# Patient Record
Sex: Male | Born: 1995 | Race: White | Hispanic: No | Marital: Single | Smoking: Current every day smoker
Health system: Southern US, Community
[De-identification: ages and names within clinical notes are randomized; demographics above are authoritative.]

---
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
## 2015-03-12 ENCOUNTER — Encounter: Payer: Self-pay | Admitting: Emergency Medicine

## 2015-03-12 ENCOUNTER — Emergency Department: Payer: BLUE CROSS/BLUE SHIELD

## 2015-03-12 ENCOUNTER — Emergency Department
Admission: EM | Admit: 2015-03-12 | Discharge: 2015-03-12 | Disposition: A | Discharge status: Home / Self Care | Payer: BLUE CROSS/BLUE SHIELD | Attending: Emergency Medicine | Admitting: Emergency Medicine

## 2015-03-12 DIAGNOSIS — F17210 Nicotine dependence, cigarettes, uncomplicated: Secondary | ICD-10-CM | POA: Diagnosis not present

## 2015-03-12 DIAGNOSIS — Y998 Other external cause status: Secondary | ICD-10-CM | POA: Diagnosis not present

## 2015-03-12 DIAGNOSIS — Y9289 Other specified places as the place of occurrence of the external cause: Secondary | ICD-10-CM | POA: Diagnosis not present

## 2015-03-12 DIAGNOSIS — Y9389 Activity, other specified: Secondary | ICD-10-CM | POA: Diagnosis not present

## 2015-03-12 DIAGNOSIS — T50901A Poisoning by unspecified drugs, medicaments and biological substances, accidental (unintentional), initial encounter: Secondary | ICD-10-CM

## 2015-03-12 DIAGNOSIS — T507X1A Poisoning by analeptics and opioid receptor antagonists, accidental (unintentional), initial encounter: Secondary | ICD-10-CM | POA: Insufficient documentation

## 2015-03-12 NOTE — Discharge Instructions (Signed)
Accidental Overdose °A drug overdose occurs when a chemical substance (drug or medication) is used in amounts large enough to overcome a person. This may result in severe illness or death. This is a type of poisoning. Accidental overdoses of medications or other substances come from a variety of reasons. When this happens accidentally, it is often because the person taking the substance does not know enough about what they have taken. Drugs which commonly cause overdose deaths are alcohol, psychotropic medications (medications which affect the mind), pain medications, illegal drugs (street drugs) such as cocaine and heroin, and multiple drugs taken at the same time. It may result from careless behavior (such as over-indulging at a party). Other causes of overdose may include multiple drug use, a lapse in memory, or drug use after a period of no drug use.  °Sometimes overdosing occurs because a person cannot remember if they have taken their medication.  °A common unintentional overdose in young children involves multi-vitamins containing iron. Iron is a part of the hemoglobin molecule in blood. It is used to transport oxygen to living cells. When taken in small amounts, iron allows the body to restock hemoglobin. In large amounts, it causes problems in the body. If this overdose is not treated, it can lead to death. °Never take medicines that show signs of tampering or do not seem quite right. Never take medicines in the dark or in poor lighting. Read the label and check each dose of medicine before you take it. When adults are poisoned, it happens most often through carelessness or lack of information. Taking medicines in the dark or taking medicine prescribed for someone else to treat the same type of problem is a dangerous practice. °SYMPTOMS  °Symptoms of overdose depend on the medication and amount taken. They can vary from over-activity with stimulant over-dosage, to sleepiness from depressants such as  alcohol, narcotics and tranquilizers. Confusion, dizziness, nausea and vomiting may be present. If problems are severe enough coma and death may result. °DIAGNOSIS  °Diagnosis and management are generally straightforward if the drug is known. Otherwise it is more difficult. At times, certain symptoms and signs exhibited by the patient, or blood tests, can reveal the drug in question.  °TREATMENT  °In an emergency department, most patients can be treated with supportive measures. Antidotes may be available if there has been an overdose of opioids or benzodiazepines. A rapid improvement will often occur if this is the cause of overdose. °At home or away from medical care: °· There may be no immediate problems or warning signs in children. °· Not everything works well in all cases of poisoning. °· Take immediate action. Poisons may act quickly. °· If you think someone has swallowed medicine or a household product, and the person is unconscious, having seizures (convulsions), or is not breathing, immediately call for an ambulance. °IF a person is conscious and appears to be doing OK but has swallowed a poison: °· Do not wait to see what effect the poison will have. Immediately call a poison control center (listed in the white pages of your telephone book under "Poison Control" or inside the front cover with other emergency numbers). Some poison control centers have TTY capability for the deaf. Check with your local center if you or someone in your family requires this service. °· Keep the container so you can read the label on the product for ingredients. °· Describe what, when, and how much was taken and the age and condition of the person poisoned.   Inform them if the person is vomiting, choking, drowsy, shows a change in color or temperature of skin, is conscious or unconscious, or is convulsing.  Do not cause vomiting unless instructed by medical personnel. Do not induce vomiting or force liquids into a person who  is convulsing, unconscious, or very drowsy. Stay calm and in control.   Activated charcoal also is sometimes used in certain types of poisoning and you may wish to add a supply to your emergency medicines. It is available without a prescription. Call a poison control center before using this medication. PREVENTION  Thousands of children die every year from unintentional poisoning. This may be from household chemicals, poisoning from carbon monoxide in a car, taking their parent's medications, or simply taking a few iron pills or vitamins with iron. Poisoning comes from unexpected sources.  Store medicines out of the sight and reach of children, preferably in a locked cabinet. Do not keep medications in a food cabinet. Always store your medicines in a secure place. Get rid of expired medications.  If you have children living with you or have them as occasional guests, you should have child-resistant caps on your medicine containers. Keep everything out of reach. Child proof your home.  If you are called to the telephone or to answer the door while you are taking a medicine, take the container with you or put the medicine out of the reach of small children.  Do not take your medication in front of children. Do not tell your child how good a medication is and how good it is for them. They may get the idea it is more of a treat.  If you are an adult and have accidentally taken an overdose, you need to consider how this happened and what can be done to prevent it from happening again. If this was from a street drug or alcohol, determine if there is a problem that needs addressing. If you are not sure a problems exists, it is easy to talk to a professional and ask them if they think you have a problem. It is better to handle this problem in this way before it happens again and has a much worse consequence.   This information is not intended to replace advice given to you by your health care provider. Make  sure you discuss any questions you have with your health care provider.   Document Released: 03/09/2004 Document Revised: 01/14/2014 Document Reviewed: 06/13/2014 Elsevier Interactive Patient Education [employer]  Drug Overdose Drug overdose happens when you take too much of a drug. An overdose can occur with illegal drugs, prescription drugs, or over-the-counter (OTC) drugs. The effects of drug overdose can be mild, dangerous, or even deadly. CAUSES Drug overdose may be caused by:  Taking too much of a drug on purpose.  Taking too much of a drug by accident.  An error made by a health care provider who prescribes a drug.  An error made by a pharmacist who fills the prescription order. Drugs that commonly cause overdose include:  Mental health drugs.  Pain medicines.  Illegal drugs.  OTC cough and cold medicines.  Heart medicines.  Seizure medicines. RISK FACTORS Drug overdose is more likely in:  Children. They may be attracted to colorful pills. Because of children's small size, even a small amount of a drug can be dangerous.  Elderly people. They may be taking many different drugs. Elderly people may have difficulty reading labels or remembering when they last took  their medicine. The risk of drug overdose is also higher for someone who:  Takes illegal drugs.  Takes a drug and drinks alcohol.  Has a mental health condition. SYMPTOMS Signs and symptoms of drug overdose depend on the drug and the amount that was taken. Common danger signs include:  Behavior changes.  Sleepiness.  Slowed breathing.  Nausea and vomiting.  Seizures.  Changes in eye pupil size (very large or very small). If there are signs of very low blood pressure from a drug overdose (shock), emergency treatment is required. These signs include:  Cold and clammy skin.  Pale skin.  Blue lips.  Very slow breathing.  Extreme sleepiness.  Loss of  consciousness. DIAGNOSIS Drug overdose may be diagnosed based on your symptoms. It is important that you tell your health care provider:  All of the drugs that you have taken.  When you took the drugs.  Whether you were drinking alcohol. Your health care provider will do a physical exam. This exam may include:  Checking and monitoring your heart rate and rhythm, your temperature, and your blood pressure (vital signs).  Checking your breathing and oxygen level. You may also have tests, including:   Urine tests to check for drugs in your system.  Blood tests to check for:  Drugs in your system.  Signs of an imbalance of your blood minerals (electrolytes).  Liver damage.  Kidney damage. TREATMENT Supporting your vital signs and your breathing is the first step in treating a drug overdose. Treatment may also include:  Receiving fluids and electrolytes through an IV tube.  Having a breathing tube (endotracheal tube) inserted in your airway to help you breathe.  Having a tube passed through your nose and into your stomach (nasogastric tube) to wash out your stomach.  Medicines. You may get medicines to:  Make you vomit.  Absorb any medicine that is left in your digestive system (activated charcoal).  Block or reverse the effect of the drug that caused the overdose.  Having your blood filtered through an artificial kidney machine (hemodialysis). You may need this if your overdose is severe or if you have kidney failure.  Having ongoing counseling and mental health support if you intentionally overdosed or used an illegal drug. HOME CARE INSTRUCTIONS  Take medicines only as directed by your health care provider. Always ask your health care provider to discuss the possible side effects of any new drug that you start taking.  Keep a list of all of the drugs that you take, including over-the-counter medicines. Bring this list with you to all of your medical visits.  Read the  drug inserts that come with your medicines.  Do not use illegal drugs.  Do not drink alcohol when taking drugs.  Store all medicines in safety containers that are out of the reach of children.  Keep the phone number of your local poison control center near your phone or on your cell phone.  Get help if you are struggling with alcohol or drug use.  Get help if you are struggling with depression or another mental health problem.  Keep all follow-up visits as directed by your health care provider. This is important. SEEK MEDICAL CARE IF:  Your symptoms return.  You develop any new signs or symptoms when you are taking medicines. SEEK IMMEDIATE MEDICAL CARE IF:  You think that you or someone else may have taken too much of a drug. The hotline of the [facility] is [phone].  You or someone else is having symptoms of a drug overdose.  You have serious thoughts about hurting yourself or others.  You have chest pain.  You have difficulty breathing.  You have a loss of consciousness. Drug overdose is an emergency. Do not wait to see if the symptoms will go away. Get medical help right away. Call your local emergency services (911 in the [place] Do not drive yourself to the hospital.   This information is not intended to replace advice given to you by your health care provider. Make sure you discuss any questions you have with your health care provider.   Document Released: 05/10/2014 Document Reviewed: 05/10/2014 Elsevier Interactive Patient Education [employer]
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
## 2015-03-12 NOTE — ED Provider Notes (Signed)
[facility] Emergency Department Provider Note  ____________________________________________  Time seen: 6:20 PM  I have reviewed the triage vital signs and the nursing notes.   HISTORY  Chief Complaint Drug Overdose    HPI Dwayne Johnson is a 20 y.o. male brought to the ED by EMS to a drug overdose. He is unable to state what it is he took but he crushed up a blue pill and started it. He was found unconscious with agonal respirations. He received bystander CPR, and then EMS gave 4 mg of Narcan with return of adequate respirations and mental status.  Patient reports he is a smoker, does not drink, does not use drugs and has never used any IV drugs. This was the first time for him. He reiterates that he has no idea what he took. Denies any attempt at self-harm. No SI and HI or hallucinations.     History reviewed. No pertinent past medical history. ADHD There are no active problems to display for this patient.    History reviewed. No pertinent past surgical history.   No current outpatient prescriptions on file. None  Allergies Review of patient's allergies indicates no known allergies.   History reviewed. No pertinent family history.  Social History Social History  Substance Use Topics  . Smoking status: Current Every Day Smoker -- 1.00 packs/day    Types: Cigarettes  . Smokeless tobacco: None  . Alcohol Use: Yes    Review of Systems  Constitutional:   No fever or chills. No weight changes Eyes:   No blurry vision or double vision.  ENT:   No sore throat.  Cardiovascular:   No chest pain. Respiratory:   No dyspnea or cough. Gastrointestinal:   Negative for abdominal pain, vomiting and diarrhea.  No BRBPR or melena. Genitourinary:   Negative for dysuria or difficulty urinating. Musculoskeletal:   Negative for back pain. No joint swelling or pain. Skin:   Negative for rash. Neurological:   Negative for headaches, focal weakness or  numbness. Psychiatric:  No anxiety or depression.   Endocrine:  No changes in energy or sleep difficulty.  10-point ROS otherwise negative.  ____________________________________________   PHYSICAL EXAM:  VITAL SIGNS: ED Triage Vitals  Enc Vitals Group     BP 03/12/15 1930 108/58 mmHg     Pulse Rate 03/12/15 1854 98     Resp 03/12/15 1854 16     Temp --      Temp src --      SpO2 03/12/15 1836 100 %     Weight --      Height --      Head Cir --      Peak Flow --      Pain Score --      Pain Loc --      Pain Edu? --      Excl. in GC? --     Vital signs reviewed, nursing assessments reviewed.   Constitutional:   Alert and oriented. Well appearing and in no distress. Eyes:   No scleral icterus. No conjunctival pallor. PERRL. EOMI ENT   Head:   Normocephalic and atraumatic.   Nose:   No congestion/rhinnorhea. No septal hematoma   Mouth/Throat:   MMM, no pharyngeal erythema. No peritonsillar mass.    Neck:   No stridor. No SubQ emphysema. No meningismus. Hematological/Lymphatic/Immunilogical:   No cervical lymphadenopathy. Cardiovascular:   RRR. Symmetric bilateral radial and DP pulses.  No murmurs.  Respiratory:   Normal  respiratory effort without tachypnea nor retractions. Breath sounds are clear and equal bilaterally. No wheezes/rales/rhonchi. Gastrointestinal:   Soft and nontender. Non distended. There is no CVA tenderness.  No rebound, rigidity, or guarding. Genitourinary:   deferred Musculoskeletal:   Nontender with normal range of motion in all extremities. No joint effusions.  No lower extremity tenderness.  No edema. Neurologic:   Normal speech and language.  CN 2-10 normal. Motor grossly intact. No gross focal neurologic deficits are appreciated.  Skin:    Skin is warm, dry and intact. No rash noted.  No petechiae, purpura, or bullae. No track marks Psychiatric:   Mood and affect are normal. ____________________________________________    LABS  (pertinent positives/negatives) (all labs ordered are listed, but only abnormal results are displayed) Labs Reviewed  BASIC METABOLIC PANEL  CBC WITH DIFFERENTIAL/PLATELET   ____________________________________________   EKG  Interpreted by me  Date: 03/12/2015  Rate: 101  Rhythm: normal sinus rhythm  QRS Axis: normal  Intervals: normal  ST/T Wave abnormalities: normal  Conduction Disutrbances: none  Narrative Interpretation: unremarkable      ____________________________________________    RADIOLOGY  Chest x-ray unremarkable  ____________________________________________   PROCEDURES   ____________________________________________   INITIAL IMPRESSION / ASSESSMENT AND PLAN / ED COURSE  Pertinent labs & imaging results that were available during my care of the patient were reviewed by me and considered in my medical decision making (see chart for details).  Patient brought in to ED after resuscitation from an apparent drug overdose with Narcan. This effectively reversed his intoxication and pharmacological, from the overdose. Patient was observed in the emergency department for several hours to ensure that after the Narcan wears off he would not have any recurrence of his symptoms and suppression of mental status or respiratory drive. Chest x-ray unremarkable, no evidence of flash pulmonary edema. Patient is asymptomatic and feels great and wants to go home and go to work tomorrow. Vital signs unremarkable. Reassessment at 10:30 shows the patient continues to do well, no change in status. We'll discharge home. Patient strongly advised to avoid any further substance abuse. I reinforced to him that he really came within an inch of death with this experience.     ____________________________________________   FINAL CLINICAL IMPRESSION(S) / ED DIAGNOSES  Final diagnoses:  Drug overdose, accidental or unintentional, initial encounter      Sharman Cheek,  MD 03/12/15 2249

## 2015-04-18 ENCOUNTER — Ambulatory Visit
Admission: RE | Admit: 2015-04-18 | Discharge: 2015-04-18 | Disposition: A | Discharge status: Home / Self Care | Payer: BLUE CROSS/BLUE SHIELD | Source: Ambulatory Visit | Attending: Family Medicine | Admitting: Family Medicine

## 2015-04-18 ENCOUNTER — Encounter: Payer: Self-pay | Admitting: Family Medicine

## 2015-04-18 ENCOUNTER — Ambulatory Visit (INDEPENDENT_AMBULATORY_CARE_PROVIDER_SITE_OTHER): Payer: BLUE CROSS/BLUE SHIELD | Admitting: Family Medicine

## 2015-04-18 VITALS — BP 116/70 | HR 89 | Temp 98.1°F | Resp 16 | Ht 73.0 in | Wt 189.0 lb

## 2015-04-18 DIAGNOSIS — S36113A Laceration of liver, unspecified degree, initial encounter: Secondary | ICD-10-CM

## 2015-04-18 DIAGNOSIS — S82002A Unspecified fracture of left patella, initial encounter for closed fracture: Secondary | ICD-10-CM | POA: Insufficient documentation

## 2015-04-18 DIAGNOSIS — R17 Unspecified jaundice: Secondary | ICD-10-CM | POA: Diagnosis not present

## 2015-04-18 DIAGNOSIS — S81012A Laceration without foreign body, left knee, initial encounter: Secondary | ICD-10-CM | POA: Insufficient documentation

## 2015-04-18 DIAGNOSIS — S81011A Laceration without foreign body, right knee, initial encounter: Secondary | ICD-10-CM | POA: Insufficient documentation

## 2015-04-18 DIAGNOSIS — M25562 Pain in left knee: Principal | ICD-10-CM

## 2015-04-18 DIAGNOSIS — L03119 Cellulitis of unspecified part of limb: Secondary | ICD-10-CM

## 2015-04-18 DIAGNOSIS — L0390 Cellulitis, unspecified: Secondary | ICD-10-CM | POA: Insufficient documentation

## 2015-04-18 DIAGNOSIS — F17200 Nicotine dependence, unspecified, uncomplicated: Secondary | ICD-10-CM

## 2015-04-18 DIAGNOSIS — Z720 Tobacco use: Secondary | ICD-10-CM

## 2015-04-18 DIAGNOSIS — M25561 Pain in right knee: Secondary | ICD-10-CM

## 2015-04-18 MED ORDER — OXYCODONE-ACETAMINOPHEN 10-325 MG PO TABS: 1.0000 | ORAL_TABLET | Freq: Three times a day (TID) | ORAL | Status: AC | PRN

## 2015-04-18 MED ORDER — CEFDINIR 300 MG PO CAPS: 300.0000 mg | ORAL_CAPSULE | Freq: Every day | ORAL | Status: AC
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
## 2015-04-18 NOTE — Patient Instructions (Signed)
Go to the [facility] on [address] for Knee Xrays

## 2015-04-19 ENCOUNTER — Telehealth: Payer: Self-pay | Admitting: *Deleted

## 2015-04-19 DIAGNOSIS — M25562 Pain in left knee: Secondary | ICD-10-CM

## 2015-04-19 LAB — COMPREHENSIVE METABOLIC PANEL
ALBUMIN: 4.4 g/dL (ref 3.5–5.5)
ALK PHOS: 72 IU/L (ref 39–117)
ALT: 59 IU/L — ABNORMAL HIGH (ref 0–44)
AST: 25 IU/L (ref 0–40)
Albumin/Globulin Ratio: 1.5 (ref 1.2–2.2)
BILIRUBIN TOTAL: 0.7 mg/dL (ref 0.0–1.2)
BUN / CREAT RATIO: 21 — AB (ref 9–20)
BUN: 16 mg/dL (ref 6–20)
CHLORIDE: 100 mmol/L (ref 96–106)
CO2: 24 mmol/L (ref 18–29)
Calcium: 9.6 mg/dL (ref 8.7–10.2)
Creatinine, Ser: 0.77 mg/dL (ref 0.76–1.27)
GFR calc Af Amer: 150 mL/min/{1.73_m2} (ref >59)
GFR calc non Af Amer: 130 mL/min/{1.73_m2} (ref >59)
GLUCOSE: 82 mg/dL (ref 65–99)
Globulin, Total: 2.9 g/dL (ref 1.5–4.5)
Potassium: 5 mmol/L (ref 3.5–5.2)
Sodium: 142 mmol/L (ref 134–144)
Total Protein: 7.3 g/dL (ref 6.0–8.5)

## 2015-04-19 LAB — CBC
HEMOGLOBIN: 14.1 g/dL (ref 12.6–17.7)
Hematocrit: 40.2 % (ref 37.5–51.0)
MCH: 30.2 pg (ref 26.6–33.0)
MCHC: 35.1 g/dL (ref 31.5–35.7)
MCV: 86 fL (ref 79–97)
PLATELETS: 457 10*3/uL — AB (ref 150–379)
RBC: 4.67 x10E6/uL (ref 4.14–5.80)
RDW: 13 % (ref 12.3–15.4)
WBC: 7.4 10*3/uL (ref 3.4–10.8)

## 2015-04-19 NOTE — Telephone Encounter (Signed)
Patient was notified of results. Patient expressed understanding. Referral entered in epic.

## 2015-04-19 NOTE — Telephone Encounter (Addendum)
Please refer to orthopedics. Patient prefers to see [facility], if his insurance cover them? Also call pt's mom with any information. Dwayne Johnson [phone]. Thanks!

## 2015-04-19 NOTE — Telephone Encounter (Signed)
-----   Message from Malva Limesonald E Fisher, MD sent at 04/19/2015  7:41 AM EDT ----- Normal blood cell count and liver functions. Left knee cap is fractured. I think this should heal up without any particular treatment, but should be referred to orthopedics to follow up on this.

## 2015-05-08 DEATH — deceased

## 2016-04-15 IMAGING — CR DG KNEE COMPLETE 4+V*L*
1 series · 4 of 4 positions shown · non-contrast
Comparison: No prior .

CLINICAL DATA: Injury.  Pain .

EXAM:
LEFT KNEE - COMPLETE 4+ VIEW

[Series 1: dg knee complete 4 views left · 0.14mm/px · 4 of 4 slices shown]
[im 1/4]
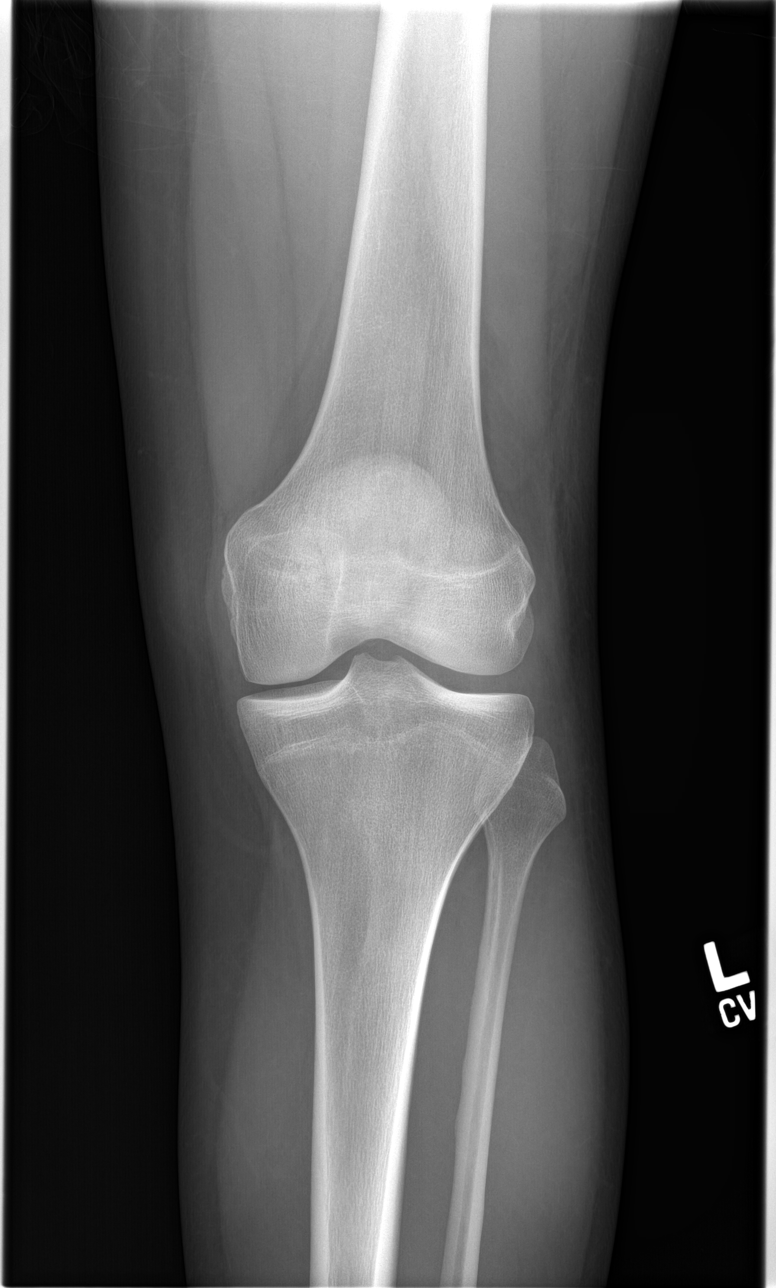
[im 2/4]
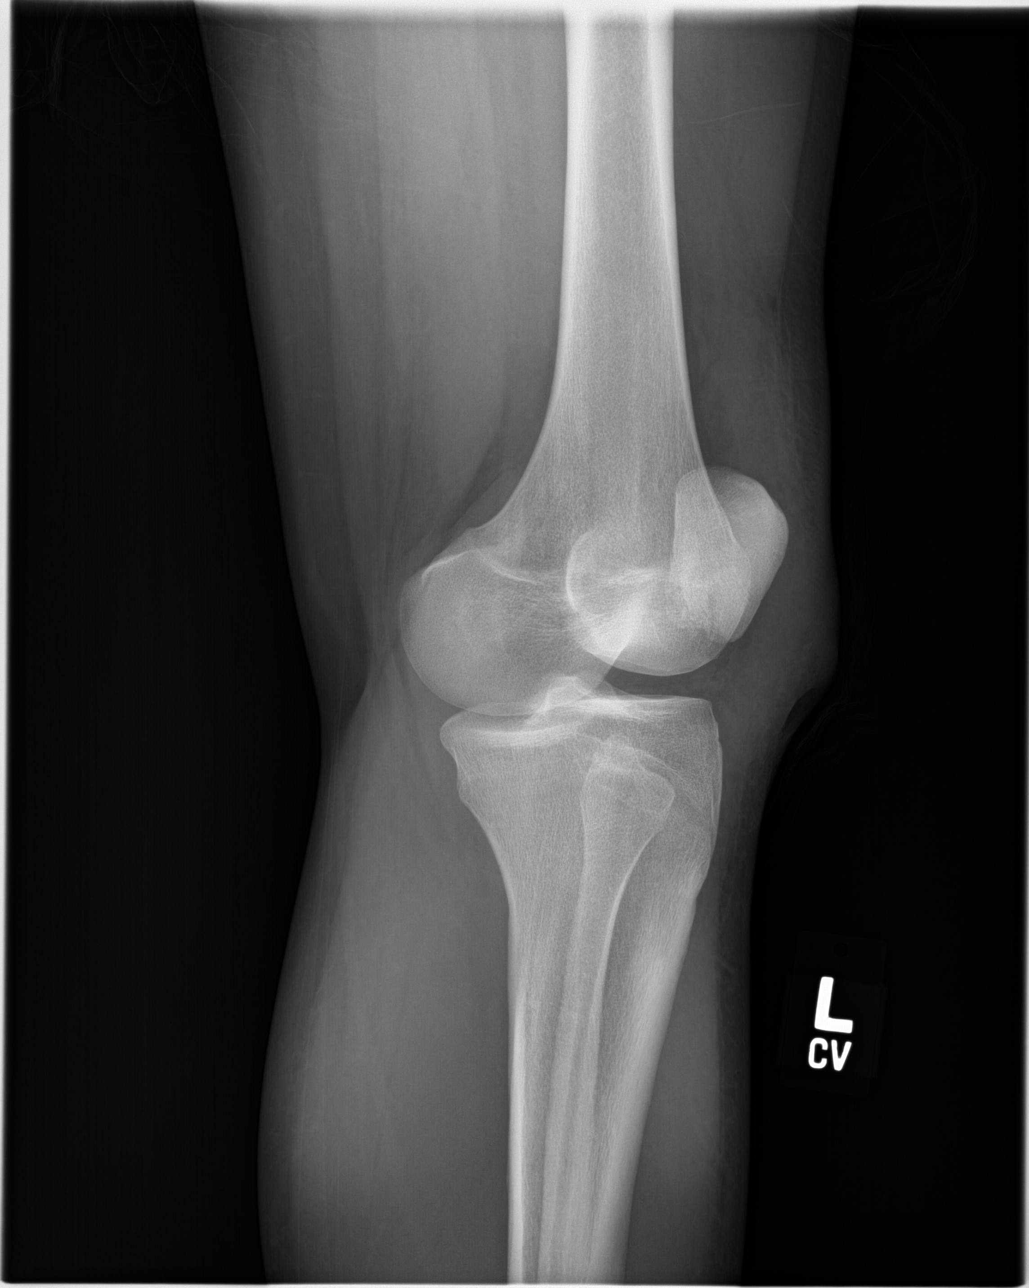
[im 3/4]
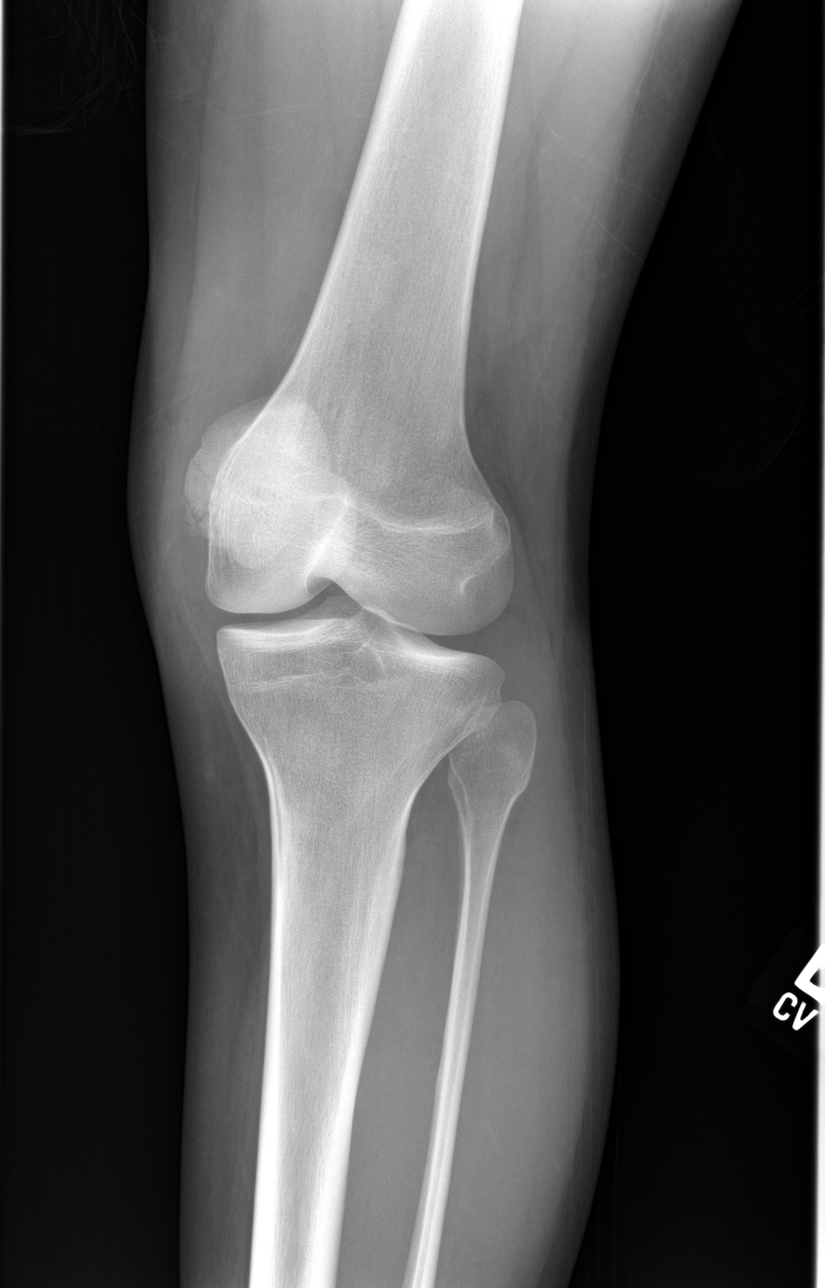
[im 4/4]
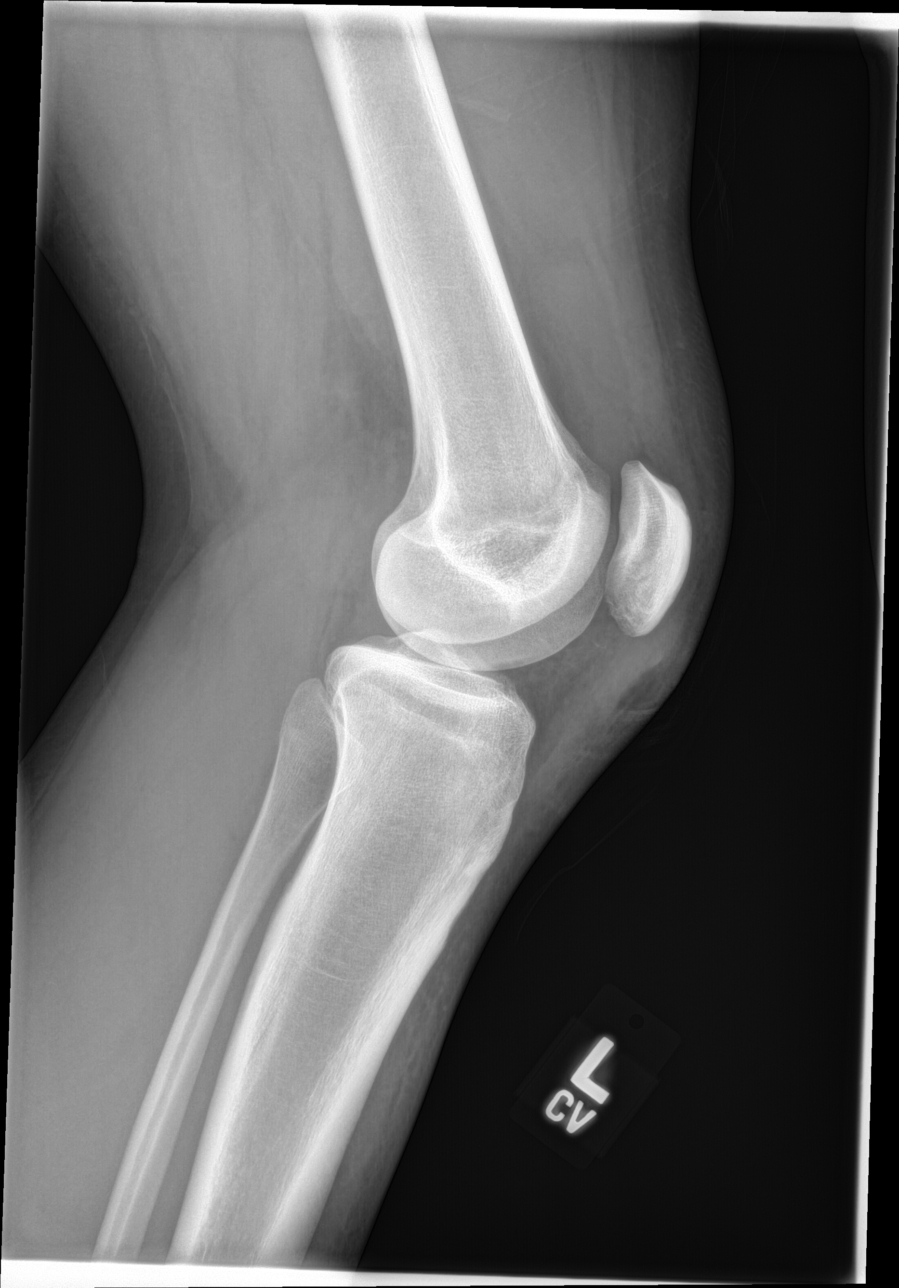

[4 of 4 positions shown; findings below may reference images not displayed]

FINDINGS: Soft tissue laceration is noted over the anterior knee. Small knee
joint effusion cannot be excluded . Comminuted slightly displaced
fracture of the lower portion of patella noted. No other acute bony
abnormality .
IMPRESSION: Soft tissue laceration noted over the anterior knee. Small knee
joint effusion cannot be excluded. Comminuted slightly displayed
fracture of the lower portion of the patella noted.
